# Patient Record
Sex: Male | Born: 1990 | Hispanic: Yes | Marital: Single
Health system: Southern US, Community
[De-identification: ages and names within clinical notes are randomized; demographics above are authoritative.]

---

## 2017-07-31 ENCOUNTER — Encounter (HOSPITAL_COMMUNITY): Payer: Self-pay

## 2017-07-31 ENCOUNTER — Emergency Department (HOSPITAL_COMMUNITY): Payer: Self-pay

## 2017-07-31 ENCOUNTER — Emergency Department (HOSPITAL_COMMUNITY)
Admission: EM | Admit: 2017-07-31 | Discharge: 2017-08-01 | Disposition: A | Discharge status: Home / Self Care | Payer: Self-pay | Attending: Emergency Medicine | Admitting: Emergency Medicine

## 2017-07-31 DIAGNOSIS — R51 Headache: Secondary | ICD-10-CM | POA: Insufficient documentation

## 2017-07-31 DIAGNOSIS — S8001XA Contusion of right knee, initial encounter: Secondary | ICD-10-CM | POA: Insufficient documentation

## 2017-07-31 DIAGNOSIS — Y92410 Unspecified street and highway as the place of occurrence of the external cause: Secondary | ICD-10-CM | POA: Insufficient documentation

## 2017-07-31 DIAGNOSIS — S20219A Contusion of unspecified front wall of thorax, initial encounter: Secondary | ICD-10-CM

## 2017-07-31 DIAGNOSIS — S20211A Contusion of right front wall of thorax, initial encounter: Secondary | ICD-10-CM | POA: Insufficient documentation

## 2017-07-31 DIAGNOSIS — R109 Unspecified abdominal pain: Secondary | ICD-10-CM | POA: Insufficient documentation

## 2017-07-31 DIAGNOSIS — Y9389 Activity, other specified: Secondary | ICD-10-CM | POA: Insufficient documentation

## 2017-07-31 DIAGNOSIS — S27321A Contusion of lung, unilateral, initial encounter: Secondary | ICD-10-CM | POA: Insufficient documentation

## 2017-07-31 DIAGNOSIS — Z23 Encounter for immunization: Secondary | ICD-10-CM | POA: Insufficient documentation

## 2017-07-31 DIAGNOSIS — Y998 Other external cause status: Secondary | ICD-10-CM | POA: Insufficient documentation

## 2017-07-31 LAB — COMPREHENSIVE METABOLIC PANEL
ALBUMIN: 4.1 g/dL (ref 3.5–5.0)
ALK PHOS: 91 U/L (ref 38–126)
ALT: 33 U/L (ref 17–63)
AST: 32 U/L (ref 15–41)
Anion gap: 11 (ref 5–15)
BILIRUBIN TOTAL: 0.7 mg/dL (ref 0.3–1.2)
BUN: 12 mg/dL (ref 6–20)
CALCIUM: 8.8 mg/dL — AB (ref 8.9–10.3)
CO2: 23 mmol/L (ref 22–32)
Chloride: 101 mmol/L (ref 101–111)
Creatinine, Ser: 0.83 mg/dL (ref 0.61–1.24)
GFR calc Af Amer: >60 mL/min (ref >60)
GFR calc non Af Amer: >60 mL/min (ref >60)
GLUCOSE: 103 mg/dL — AB (ref 65–99)
Potassium: 3.7 mmol/L (ref 3.5–5.1)
Sodium: 135 mmol/L (ref 135–145)
TOTAL PROTEIN: 6.6 g/dL (ref 6.5–8.1)

## 2017-07-31 LAB — CBC
HEMATOCRIT: 44.2 % (ref 39.0–52.0)
HEMOGLOBIN: 15.1 g/dL (ref 13.0–17.0)
MCH: 30.5 pg (ref 26.0–34.0)
MCHC: 34.2 g/dL (ref 30.0–36.0)
MCV: 89.3 fL (ref 78.0–100.0)
Platelets: 268 10*3/uL (ref 150–400)
RBC: 4.95 MIL/uL (ref 4.22–5.81)
RDW: 12.8 % (ref 11.5–15.5)
WBC: 13.7 10*3/uL — ABNORMAL HIGH (ref 4.0–10.5)

## 2017-07-31 LAB — I-STAT CHEM 8, ED
BUN: 16 mg/dL (ref 6–20)
CHLORIDE: 103 mmol/L (ref 101–111)
Calcium, Ion: 0.93 mmol/L — ABNORMAL LOW (ref 1.15–1.40)
Creatinine, Ser: 1 mg/dL (ref 0.61–1.24)
Glucose, Bld: 98 mg/dL (ref 65–99)
HCT: 46 % (ref 39.0–52.0)
Hemoglobin: 15.6 g/dL (ref 13.0–17.0)
POTASSIUM: 4.1 mmol/L (ref 3.5–5.1)
SODIUM: 136 mmol/L (ref 135–145)
TCO2: 23 mmol/L (ref 22–32)

## 2017-07-31 LAB — ETHANOL: Alcohol, Ethyl (B): 129 mg/dL — ABNORMAL HIGH (ref <10)

## 2017-07-31 LAB — PROTIME-INR
INR: 0.94
Prothrombin Time: 12.5 seconds (ref 11.4–15.2)

## 2017-07-31 LAB — I-STAT CG4 LACTIC ACID, ED: LACTIC ACID, VENOUS: 1.92 mmol/L — AB (ref 0.5–1.9)

## 2017-07-31 LAB — CDS SEROLOGY

## 2017-07-31 MED ORDER — IOPAMIDOL (ISOVUE-300) INJECTION 61%
100.0000 mL | Freq: Once | INTRAVENOUS | Status: AC | PRN
  Administered 2017-07-31: 100 mL via INTRAVENOUS

## 2017-07-31 MED ORDER — TETANUS-DIPHTH-ACELL PERTUSSIS 5-2.5-18.5 LF-MCG/0.5 IM SUSP
0.5000 mL | Freq: Once | INTRAMUSCULAR | Status: AC
  Administered 2017-07-31: 0.5 mL via INTRAMUSCULAR
  Filled 2017-07-31: qty 0.5

## 2017-07-31 MED ORDER — HYDROMORPHONE HCL 1 MG/ML IJ SOLN
1.0000 mg | Freq: Once | INTRAMUSCULAR | Status: AC
  Administered 2017-07-31: 1 mg via INTRAVENOUS
  Filled 2017-07-31: qty 1

## 2017-07-31 MED ORDER — MORPHINE SULFATE (PF) 4 MG/ML IV SOLN
4.0000 mg | Freq: Once | INTRAVENOUS | Status: AC
  Administered 2017-07-31: 4 mg via INTRAVENOUS
  Filled 2017-07-31: qty 1

## 2017-07-31 NOTE — ED Notes (Signed)
Patient transported to CT 
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
## 2017-07-31 NOTE — ED Notes (Signed)
Cell phone and wallet with patient at bedside.

## 2017-08-01 LAB — SAMPLE TO BLOOD BANK

## 2017-08-01 MED ORDER — HYDROCODONE-ACETAMINOPHEN 5-325 MG PO TABS: 1.0000 | ORAL_TABLET | Freq: Four times a day (QID) | ORAL | 0 refills | Status: AC | PRN

## 2017-08-01 NOTE — ED Notes (Signed)
Pt and caregiver understood dc material. NAD noted. Script and doctors note given at [facility]
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
